# Patient Record
Sex: Male | Born: 1970 | Race: White | Hispanic: Yes | Marital: Married | Smoking: Never smoker
Health system: Southern US, Community
[De-identification: ages and names within clinical notes are randomized; demographics above are authoritative.]

## PROBLEM LIST (undated history)

## (undated) DIAGNOSIS — I10 Essential (primary) hypertension: Secondary | ICD-10-CM

## (undated) DIAGNOSIS — E785 Hyperlipidemia, unspecified: Secondary | ICD-10-CM

## (undated) HISTORY — DX: Essential (primary) hypertension: I10

## (undated) HISTORY — DX: Hyperlipidemia, unspecified: E78.5

---

## 2006-07-31 ENCOUNTER — Ambulatory Visit (HOSPITAL_COMMUNITY): Admission: RE | Admit: 2006-07-31 | Discharge: 2006-07-31 | Payer: Self-pay | Admitting: General Surgery

## 2015-08-28 LAB — CBC, NO DIFFERENTIAL/PLATELET
ALK PHOS: 88 U/L
ALT: 34
AST: 22 U/L
Albumin: 4.2
BILIRUBIN TOTAL: 0.8 mg/dL
BUN: 14
CALCIUM: 8.7 mg/dL
CHOL/HDL RATIO: 3.5
CHOLESTEROL: 88
CO2: 26 mmol/L
CREATININE: 0.66
Chloride: 104 mmol/L
GLUCOSE: 105
HCT: 46 %
HDL: 25
Hemoglobin: 16.5
Ldl Cholesterol, Calc: 37
MCH: 32
MCHC: 35.8
MCV: 89.3
MPV: 10 fL (ref 7.5–11.5)
PLATELET COUNT: 207
Potassium: 3.9 mmol/L
RBC: 5.16
RDW: 13.9
SODIUM: 139
TRIGLYCERIDES: 131
Total Protein: 6.5 g/dL
VLDL CHOLESTEROL, CALC: 26
WBC: 4.6

## 2015-08-29 ENCOUNTER — Ambulatory Visit: Payer: Self-pay | Admitting: Physician Assistant

## 2015-09-04 ENCOUNTER — Ambulatory Visit: Payer: Self-pay | Admitting: Physician Assistant

## 2015-09-04 ENCOUNTER — Encounter: Payer: Self-pay | Admitting: Physician Assistant

## 2015-09-04 VITALS — BP 128/88 | HR 75 | Temp 97.7°F | Ht 63.5 in | Wt 177.4 lb

## 2015-09-04 DIAGNOSIS — E785 Hyperlipidemia, unspecified: Secondary | ICD-10-CM | POA: Insufficient documentation

## 2015-09-04 DIAGNOSIS — R079 Chest pain, unspecified: Secondary | ICD-10-CM

## 2015-09-04 MED ORDER — METOPROLOL TARTRATE 25 MG PO TABS: 25.0000 mg | ORAL_TABLET | Freq: Two times a day (BID) | ORAL | Status: AC

## 2015-09-04 NOTE — Progress Notes (Signed)
BP 128/88 mmHg  Pulse 75  Temp(Src) 97.7 F (36.5 C)  Ht 5' 3.5" (1.613 m)  Wt 177 lb 6.4 oz (80.468 kg)  BMI 30.93 kg/m2  SpO2 99%   Subjective:    Patient ID: Juan Kane, male    DOB: 04/22/1971, 44 y.o.   MRN: [id number]  HPI: Juan Kane is a 44 y.o. male presenting on 09/04/2015 for Hyperlipidemia   HPI  Chief Complaint  Patient presents with  . Hyperlipidemia    pt got labs drawn last week    Juan Kane is a very pleasant hispanic gentleman who speaks good [place]. He has [facility] coming [facility] a year and half. When he first became a pt, his triglicerides were over 500.    Pt states he is doing well but sometimes has chest discomfort when he is outside working.  He says that it feels tight and squeezing.  He does not have these pains when he is sitting around watching TV.  Happens sometimes once/week, sometimes twice, sometimes goes a week without the pain.    Pt feels like it is more often when he eats fried foods but says he doesn't eat fried foods any more b/c he is watching his low-fat diet.  He doesn't have any related sob, nausea or diaphoresis.  Pt is resistant to use the word pain, but is happy with discomfort and repeatedly squeezes his fist to describe the pain.   He says he has to stop and take a rest when the discomfort squeezes his chest.   Relevant past medical, surgical, family and social history reviewed and updated as indicated. Interim medical history since our last visit reviewed. Allergies and medications reviewed and updated.  Current outpatient prescriptions:  .  Omega-3 Fatty Acids (FISH OIL PO), Take by mouth. 2 caps daily, Disp: , Rfl:  .  simvastatin (ZOCOR) 20 MG tablet, Take 20 mg by mouth at bedtime., Disp: , Rfl:    Review of Systems  Constitutional: Negative for fever, chills, diaphoresis, appetite change, fatigue and unexpected weight change.  HENT: Positive for ear pain. Negative for congestion, dental problem, drooling,  facial swelling, hearing loss, mouth sores, sneezing, sore throat, trouble swallowing and voice change.   Eyes: Negative for pain, discharge, redness, itching and visual disturbance.  Respiratory: Positive for chest tightness. Negative for cough, choking, shortness of breath and wheezing.   Cardiovascular: Positive for chest pain ("discomfort"). Negative for palpitations and leg swelling.  Gastrointestinal: Negative for vomiting, abdominal pain, diarrhea, constipation and blood in stool.  Endocrine: Negative for cold intolerance, heat intolerance and polydipsia.  Genitourinary: Negative for dysuria, hematuria and decreased urine volume.  Musculoskeletal: Negative for back pain, arthralgias and gait problem.  Skin: Negative for rash.  Allergic/Immunologic: Negative for environmental allergies.  Neurological: Negative for seizures, syncope, light-headedness and headaches.  Hematological: Negative for adenopathy.  Psychiatric/Behavioral: Negative for suicidal ideas, dysphoric mood and agitation. The patient is not nervous/anxious.     Per HPI unless specifically indicated above     Objective:    BP 128/88 mmHg  Pulse 75  Temp(Src) 97.7 F (36.5 C)  Ht 5' 3.5" (1.613 m)  Wt 177 lb 6.4 oz (80.468 kg)  BMI 30.93 kg/m2  SpO2 99%  Wt Readings from Last 3 Encounters:  09/04/15 177 lb 6.4 oz (80.468 kg)    Physical Exam  Constitutional: He is oriented to person, place, and time. He appears well-developed and well-nourished.  HENT:  Head: Normocephalic and  atraumatic.  Neck: Neck supple.  Cardiovascular: Normal rate, regular rhythm and normal heart sounds.   Pulmonary/Chest: Effort normal and breath sounds normal. He has no wheezes. He exhibits no tenderness.  Abdominal: Soft. Bowel sounds are normal. He exhibits no distension. There is no hepatosplenomegaly. There is no tenderness.  Musculoskeletal: He exhibits no edema.       Right shoulder: He exhibits normal range of motion and no  tenderness.       Left shoulder: He exhibits normal range of motion and no tenderness.  Lymphadenopathy:    He has no cervical adenopathy.  Neurological: He is alert and oriented to person, place, and time.  Skin: Skin is warm and dry.  Psychiatric: He has a normal mood and affect. His behavior is normal.  Vitals reviewed.  EKG:  NSR with ST changes. No previous for comparison  No results found for this or any previous visit.    Assessment & Plan:   Encounter Diagnoses  Name Primary?  . Hyperlipemia Yes  . Chest pain, unspecified chest pain type    -reviewed labs with pt.  Lipids are well controlled on current Rx and lowfat diet -Add low dose of metoprolol until pt can get in to be seen by cardiology- referred for further evaluation of CP. -Pt given cone discount application -F/u OV 1 mo

## 2015-09-06 ENCOUNTER — Encounter: Payer: Self-pay | Admitting: Physician Assistant

## 2015-09-17 ENCOUNTER — Ambulatory Visit (INDEPENDENT_AMBULATORY_CARE_PROVIDER_SITE_OTHER): Payer: Self-pay | Admitting: Cardiology

## 2015-09-17 ENCOUNTER — Encounter: Payer: Self-pay | Admitting: Cardiology

## 2015-09-17 VITALS — BP 124/78 | HR 75 | Ht 64.0 in | Wt 176.0 lb

## 2015-09-17 DIAGNOSIS — R079 Chest pain, unspecified: Secondary | ICD-10-CM

## 2015-09-17 NOTE — Progress Notes (Signed)
Patient ID: Juan CockayneGenaro Kane, male   DOB: 10/01/1970, 44 y.o.   MRN: [id number]     Clinical Summary Juan Kane is a 44 y.o.male seen today a a new patient for the following medical problems.  1. Chest pain - started about 3 months ago. The character of the pain is diffiicult for him to describe.Rates 4/10 in severity. Can occur at rest or with exertion. No other associated symptoms. Lasts a few minutes. Can be better with position at times. Occurs one to two times a week. No palpitations. No relation to food.  - No DOE, no LE edema   CAD risk factors: high choletserol  PMH 1. Hyperlipidmeia   No Known Allergies   Current Outpatient Prescriptions  Medication Sig Dispense Refill  . metoprolol tartrate (LOPRESSOR) 25 MG tablet Take 1 tablet (25 mg total) by mouth 2 (two) times daily. Tome una tableta por boca dos veces diarias 60 tablet 3  . Omega-3 Fatty Acids (FISH OIL PO) Take by mouth. 2 caps daily    . simvastatin (ZOCOR) 20 MG tablet Take 20 mg by mouth at bedtime.     No current facility-administered medications for this visit.     No past surgical history on file.   No Known Allergies    Denies family history of heart disease   Social History Juan Kane reports that he has never smoked. He does not have any smokeless tobacco history on file. Juan Kane has no alcohol history on file.   Review of Systems CONSTITUTIONAL: No weight loss, fever, chills, weakness or fatigue.  HEENT: Eyes: No visual loss, blurred vision, double vision or yellow sclerae.No hearing loss, sneezing, congestion, runny nose or sore throat.  SKIN: No rash or itching.  CARDIOVASCULAR: per HPI RESPIRATORY: No shortness of breath, cough or sputum.  GASTROINTESTINAL: No anorexia, nausea, vomiting or diarrhea. No abdominal pain or blood.  GENITOURINARY: No burning on urination, no polyuria NEUROLOGICAL: No headache, dizziness, syncope, paralysis, ataxia,  numbness or tingling in the extremities. No change in bowel or bladder control.  MUSCULOSKELETAL: No muscle, back pain, joint pain or stiffness.  LYMPHATICS: No enlarged nodes. No history of splenectomy.  PSYCHIATRIC: No history of depression or anxiety.  ENDOCRINOLOGIC: No reports of sweating, cold or heat intolerance. No polyuria or polydipsia.  Marland Kitchen.   Physical Examination Filed Vitals:   09/17/15 0814  BP: 124/78  Pulse: 75   Filed Weights   09/17/15 0814  Weight: 176 lb (79.833 kg)    Gen: resting comfortably, no acute distress HEENT: no scleral icterus, pupils equal round and reactive, no palptable cervical adenopathy,  CV: RRR, no m/r/g, no jvd Resp: Clear to auscultation bilaterally GI: abdomen is soft, non-tender, non-distended, normal bowel sounds, no hepatosplenomegaly MSK: extremities are warm, no edema.  Skin: warm, no rash Neuro:  no focal deficits Psych: appropriate affect   Diagnostic Studies EKG: nsr    Assessment and Plan   1. Chest pain - unclear etiology. We will obtain a GXT to evaluate for ischemia. He will hold lopressor on day of test.   F/u based on stress results Antoine PocheJonathan F. Adiyah Lame, M.D.

## 2015-09-17 NOTE — Patient Instructions (Signed)
Medication Instructions: . Your physician recommends that you continue on your current medications as directed. Please refer to the Current Medication list given to you today.   Labwork: NONE  Testing/Procedures: Your physician has requested that you have an exercise tolerance test. For further information please visit [email or web address]. Please also follow instruction sheet, as given.    Follow-Up:  Your physician recommends that you schedule a follow-up appointment in: To be determined once we get results to GXT    Any Other Special Instructions Will Be Listed Below (If Applicable).  ** On the day of the GXT test DO NOT TAKE YOUR METOPROLOL**     If you need a refill on your cardiac medications before your next appointment, please call your pharmacy.

## 2015-09-27 ENCOUNTER — Ambulatory Visit (HOSPITAL_COMMUNITY)
Admission: RE | Admit: 2015-09-27 | Discharge: 2015-09-27 | Disposition: A | Discharge status: Home / Self Care | Payer: Self-pay | Source: Ambulatory Visit | Attending: Cardiology | Admitting: Cardiology

## 2015-09-27 DIAGNOSIS — R079 Chest pain, unspecified: Secondary | ICD-10-CM

## 2015-10-04 ENCOUNTER — Ambulatory Visit: Payer: Self-pay | Admitting: Physician Assistant

## 2015-10-08 ENCOUNTER — Ambulatory Visit (HOSPITAL_COMMUNITY)
Admission: RE | Admit: 2015-10-08 | Discharge: 2015-10-08 | Disposition: A | Discharge status: Home / Self Care | Payer: Self-pay | Source: Ambulatory Visit | Attending: Cardiology | Admitting: Cardiology

## 2015-10-08 DIAGNOSIS — R079 Chest pain, unspecified: Secondary | ICD-10-CM | POA: Insufficient documentation

## 2015-10-08 DIAGNOSIS — R9439 Abnormal result of other cardiovascular function study: Secondary | ICD-10-CM | POA: Insufficient documentation

## 2015-10-08 LAB — EXERCISE TOLERANCE TEST
CHL CUP MPHR: 178 {beats}/min
CHL CUP RESTING HR STRESS: 71 {beats}/min
CHL RATE OF PERCEIVED EXERTION: 13
CSEPEDS: 34 s
Estimated workload: 15.5 METS
Exercise duration (min): 12 min
Peak HR: 160 {beats}/min
Percent HR: 89 %

## 2015-10-09 ENCOUNTER — Other Ambulatory Visit: Payer: Self-pay

## 2015-10-09 ENCOUNTER — Telehealth: Payer: Self-pay

## 2015-10-09 DIAGNOSIS — R9439 Abnormal result of other cardiovascular function study: Secondary | ICD-10-CM

## 2015-10-09 NOTE — Telephone Encounter (Signed)
Lexi is scheduled for Monday January 16,2017. Register @ 8 am in the radiology dept.

## 2015-10-09 NOTE — Telephone Encounter (Signed)
Called pt, no answer so I left voicemail to call back. Put order in for Lexi

## 2015-10-09 NOTE — Telephone Encounter (Signed)
-----   Message from Jonathan F Branch, MD sent at 10/09/2015 11:15 AM EST ----- Stress test was borderline abnormal, I'd like to do an additional test to get more information to help clafiry if he has any blockages or note. Can we order a lexiscan stress test for him  J Branch MD 

## 2015-10-09 NOTE — Telephone Encounter (Signed)
-----   Message from Antoine PocheJonathan F Branch, MD sent at 10/09/2015 11:15 AM EST ----- Stress test was borderline abnormal, I'd like to do an additional test to get more information to help clafiry if he has any blockages or note. Can we order a lexiscan stress test for him  Dominga FerryJ Branch MD

## 2015-10-15 ENCOUNTER — Encounter (HOSPITAL_COMMUNITY)
Admission: RE | Admit: 2015-10-15 | Discharge: 2015-10-15 | Disposition: A | Discharge status: Home / Self Care | Payer: Self-pay | Source: Ambulatory Visit | Attending: Cardiology | Admitting: Cardiology

## 2015-10-15 ENCOUNTER — Encounter (HOSPITAL_COMMUNITY): Payer: Self-pay

## 2015-10-15 DIAGNOSIS — R9439 Abnormal result of other cardiovascular function study: Secondary | ICD-10-CM

## 2015-10-15 LAB — NM MYOCAR MULTI W/SPECT W/WALL MOTION / EF
CHL CUP NUCLEAR SDS: 3
CHL CUP NUCLEAR SRS: 4
CHL CUP RESTING HR STRESS: 67 {beats}/min
LV sys vol: 26 mL
LVDIAVOL: 78 mL
Peak HR: 93 {beats}/min
RATE: 0.27
SSS: 7
TID: 1.12

## 2015-10-15 MED ORDER — REGADENOSON 0.4 MG/5ML IV SOLN
INTRAVENOUS | Status: AC
  Administered 2015-10-15: 0.4 mg
  Filled 2015-10-15: qty 5

## 2015-10-15 MED ORDER — TECHNETIUM TC 99M SESTAMIBI GENERIC - CARDIOLITE
10.0000 | Freq: Once | INTRAVENOUS | Status: AC | PRN
  Administered 2015-10-15: 10 via INTRAVENOUS

## 2015-10-15 MED ORDER — TECHNETIUM TC 99M SESTAMIBI - CARDIOLITE
30.0000 | Freq: Once | INTRAVENOUS | Status: AC | PRN
  Administered 2015-10-15: 09:00:00 30 via INTRAVENOUS

## 2015-10-16 ENCOUNTER — Ambulatory Visit: Payer: Self-pay | Admitting: Physician Assistant

## 2015-10-30 ENCOUNTER — Ambulatory Visit: Payer: Self-pay | Admitting: Physician Assistant

## 2015-10-31 ENCOUNTER — Encounter: Payer: Self-pay | Admitting: Physician Assistant

## 2015-11-06 ENCOUNTER — Other Ambulatory Visit: Payer: Self-pay | Admitting: Physician Assistant

## 2015-11-06 MED ORDER — SIMVASTATIN 20 MG PO TABS: 20.0000 mg | ORAL_TABLET | Freq: Every day | ORAL | Status: DC

## 2015-11-14 ENCOUNTER — Ambulatory Visit: Payer: Self-pay | Admitting: Physician Assistant

## 2015-11-14 ENCOUNTER — Encounter: Payer: Self-pay | Admitting: Physician Assistant

## 2015-11-14 VITALS — BP 138/82 | HR 79 | Temp 98.1°F | Ht 64.0 in | Wt 184.7 lb

## 2015-11-14 DIAGNOSIS — E785 Hyperlipidemia, unspecified: Secondary | ICD-10-CM

## 2015-11-14 DIAGNOSIS — I10 Essential (primary) hypertension: Secondary | ICD-10-CM

## 2015-11-14 DIAGNOSIS — E669 Obesity, unspecified: Secondary | ICD-10-CM

## 2015-11-14 NOTE — Progress Notes (Signed)
BP 144/86 mmHg  Pulse 79  Temp(Src) 98.1 F (36.7 C)  Ht  (1.626 m)  Wt 184 lb 11.2 oz (83.779 kg)  BMI 31.69 kg/m2  SpO2 95%   Subjective:    Patient ID: Juan Kane, Juan Kane    DOB: 08/10/1971, 45 y.o.   MRN: [id number]  HPI: Juan Kane is a 45 y.o. Juan Kane presenting on 11/14/2015 for Follow-up   HPI  Chief Complaint  Patient presents with  . Follow-up    pt states he has not taken his BP meds yet    Pt had normal lexiscan on 10/15/15. Pt is feeling well.  He wants to stop his metoprololol. Pt says he isn't having any more chest pains.  Relevant past medical, surgical, family and social history reviewed and updated as indicated. Interim medical history since our last visit reviewed. Allergies and medications reviewed and updated.  Current outpatient prescriptions:  .  metoprolol tartrate (LOPRESSOR) 25 MG tablet, Take 1 tablet (25 mg total) by mouth 2 (two) times daily. Tome una tableta por boca dos veces diarias, Disp: 60 tablet, Rfl: 3 .  Omega-3 Fatty Acids (FISH OIL PO), Take by mouth. 2 caps daily, Disp: , Rfl:  .  simvastatin (ZOCOR) 20 MG tablet, Take 1 tablet (20 mg total) by mouth at bedtime. Tome una tableta por boca al dormir, Disp: 30 tablet, Rfl: 0   Review of Systems  Constitutional: Negative for fever, chills, diaphoresis, appetite change, fatigue and unexpected weight change.  HENT: Negative for congestion, dental problem, drooling, ear pain, facial swelling, hearing loss, mouth sores, sneezing, sore throat, trouble swallowing and voice change.   Eyes: Negative for pain, discharge, redness, itching and visual disturbance.  Respiratory: Negative for cough, choking, shortness of breath and wheezing.   Cardiovascular: Negative for chest pain, palpitations and leg swelling.  Gastrointestinal: Negative for vomiting, abdominal pain, diarrhea, constipation and blood in stool.  Endocrine: Negative for cold intolerance, heat intolerance and  polydipsia.  Genitourinary: Negative for dysuria, hematuria and decreased urine volume.  Musculoskeletal: Negative for back pain, arthralgias and gait problem.  Skin: Negative for rash.  Allergic/Immunologic: Negative for environmental allergies.  Neurological: Negative for seizures, syncope, light-headedness and headaches.  Hematological: Negative for adenopathy.  Psychiatric/Behavioral: Negative for suicidal ideas, dysphoric mood and agitation. The patient is not nervous/anxious.     Per HPI unless specifically indicated above     Objective:    BP 144/86 mmHg  Pulse 79  Temp(Src) 98.1 F (36.7 C)  Ht  (1.626 m)  Wt 184 lb 11.2 oz (83.779 kg)  BMI 31.69 kg/m2  SpO2 95%  Wt Readings from Last 3 Encounters:  11/14/15 184 lb 11.2 oz (83.779 kg)  09/17/15 176 lb (79.833 kg)  09/04/15 177 lb 6.4 oz (80.468 kg)    Physical Exam  Constitutional: He is oriented to person, place, and time. He appears well-developed and well-nourished.  HENT:  Head: Normocephalic and atraumatic.  Neck: Neck supple.  Cardiovascular: Normal rate and regular rhythm.   Pulmonary/Chest: Effort normal and breath sounds normal. He has no wheezes.  Abdominal: Soft. Bowel sounds are normal. There is no hepatosplenomegaly. There is no tenderness.  Musculoskeletal: He exhibits no edema.  Lymphadenopathy:    He has no cervical adenopathy.  Neurological: He is alert and oriented to person, place, and time.  Skin: Skin is warm and dry.  Psychiatric: He has a normal mood and affect. His behavior is normal.  Vitals reviewed.  Assessment & Plan:   Encounter Diagnoses  Name Primary?  . Essential hypertension, benign Yes  . Obesity, unspecified   . Hyperlipidemia     -Continue metoprolol due to bp elevated today -F/u 2 mo with recheck lipids and re-evaluate bp

## 2015-11-15 ENCOUNTER — Ambulatory Visit: Payer: Self-pay | Admitting: Physician Assistant

## 2015-11-16 ENCOUNTER — Encounter: Payer: Self-pay | Admitting: *Deleted

## 2015-11-19 ENCOUNTER — Ambulatory Visit: Payer: Self-pay | Admitting: Cardiology

## 2015-11-28 ENCOUNTER — Ambulatory Visit: Payer: Self-pay | Admitting: Cardiology

## 2015-12-11 ENCOUNTER — Ambulatory Visit: Payer: Self-pay | Admitting: Cardiology

## 2015-12-24 ENCOUNTER — Ambulatory Visit: Payer: Self-pay | Admitting: Cardiology

## 2015-12-28 ENCOUNTER — Encounter: Payer: Self-pay | Admitting: Cardiology

## 2015-12-28 ENCOUNTER — Ambulatory Visit (INDEPENDENT_AMBULATORY_CARE_PROVIDER_SITE_OTHER): Payer: Self-pay | Admitting: Cardiology

## 2015-12-28 VITALS — BP 146/88 | HR 67 | Ht 64.0 in | Wt 179.0 lb

## 2015-12-28 DIAGNOSIS — R079 Chest pain, unspecified: Secondary | ICD-10-CM

## 2015-12-28 NOTE — Progress Notes (Signed)
Patient ID: Juan CockayneGenaro Kane, male   DOB: 07/02/1971, 45 y.o.   MRN: [id number]     Clinical Summary Juan Kane is a 45 y.o.male seen today for follow up of the following medical problems.   1. Chest pain - last visit he reported episodes of chest pain. He was referred for an exercise stress test which was equivocal. Stress imaging later show no evidence of ischemia - since last visit denies any recurrent episodes of chest pain.     Past Medical History  Diagnosis Date  . Hyperlipidemia   . Hypertension      No Known Allergies   Current Outpatient Prescriptions  Medication Sig Dispense Refill  . metoprolol tartrate (LOPRESSOR) 25 MG tablet Take 1 tablet (25 mg total) by mouth 2 (two) times daily. Tome una tableta por boca dos veces diarias 60 tablet 3  . Omega-3 Fatty Acids (FISH OIL PO) Take by mouth. 2 caps daily    . simvastatin (ZOCOR) 20 MG tablet Take 1 tablet (20 mg total) by mouth at bedtime. Tome una tableta por boca al dormir 30 tablet 0   No current facility-administered medications for this visit.     No past surgical history on file.   No Known Allergies    Family History He has no family history of heart disease.    Social History Juan Kane reports that he has never smoked. He does not have any smokeless tobacco history on file. Juan Kane has no alcohol history on file.   Review of Systems CONSTITUTIONAL: No weight loss, fever, chills, weakness or fatigue.  HEENT: Eyes: No visual loss, blurred vision, double vision or yellow sclerae.No hearing loss, sneezing, congestion, runny nose or sore throat.  SKIN: No rash or itching.  CARDIOVASCULAR: per HPI RESPIRATORY: No shortness of breath, cough or sputum.  GASTROINTESTINAL: No anorexia, nausea, vomiting or diarrhea. No abdominal pain or blood.  GENITOURINARY: No burning on urination, no polyuria NEUROLOGICAL: No headache, dizziness, syncope, paralysis, ataxia,  numbness or tingling in the extremities. No change in bowel or bladder control.  MUSCULOSKELETAL: No muscle, back pain, joint pain or stiffness.  LYMPHATICS: No enlarged nodes. No history of splenectomy.  PSYCHIATRIC: No history of depression or anxiety.  ENDOCRINOLOGIC: No reports of sweating, cold or heat intolerance. No polyuria or polydipsia.  Marland Kitchen.   Physical Examination Filed Vitals:   12/28/15 1556  BP: 146/88  Pulse: 67   Filed Vitals:   12/28/15 1556  Height: 5\' 4"  (1.626 m)  Weight: 179 lb (81.194 kg)    Gen: resting comfortably, no acute distress HEENT: no scleral icterus, pupils equal round and reactive, no palptable cervical adenopathy,  CV: RRR, no m/r/g, no jvd Resp: Clear to auscultation bilaterally GI: abdomen is soft, non-tender, non-distended, normal bowel sounds, no hepatosplenomegaly MSK: extremities are warm, no edema.  Skin: warm, no rash Neuro:  no focal deficits Psych: appropriate affect   Diagnostic Studies  Jan 2017 GXT  Horizontal ST segment depression (0.5-1 mm) ST segment depression was noted during stress in the II, III, aVF, V5 and V6 leads, beginning at 3 minutes of stress, and returning to baseline after 5-9 minutes of recovery.  Excellent functional capacity.  No arrhythmias.  Consider noninvasive imaging if deemed clincally indicated.  Low risk Duke treadmill score of 8 still portends a favorable prognosis.   Jan 2017 Lexiscan  There was no ST segment deviation noted during stress.  The study is normal.  This is a low risk study.  The  left ventricular ejection fraction is hyperdynamic (>65%).   Assessment and Plan   1. Chest pain - overall stress testing negative or ischemia. Symptoms have resolved. No further testing at this time.    F/u 1 year     Antoine Poche, M.D.

## 2015-12-28 NOTE — Patient Instructions (Signed)

## 2016-01-08 ENCOUNTER — Other Ambulatory Visit: Payer: Self-pay | Admitting: Student

## 2016-01-08 ENCOUNTER — Ambulatory Visit: Payer: Self-pay | Admitting: Physician Assistant

## 2016-01-08 DIAGNOSIS — E785 Hyperlipidemia, unspecified: Secondary | ICD-10-CM

## 2016-01-15 ENCOUNTER — Ambulatory Visit: Payer: Self-pay | Admitting: Physician Assistant

## 2016-01-22 LAB — COMPLETE METABOLIC PANEL WITH GFR
ALT: 53 U/L — ABNORMAL HIGH (ref 9–46)
AST: 28 U/L (ref 10–40)
Albumin: 4.6 g/dL (ref 3.6–5.1)
Alkaline Phosphatase: 81 U/L (ref 40–115)
BUN: 12 mg/dL (ref 7–25)
CHLORIDE: 104 mmol/L (ref 98–110)
CO2: 28 mmol/L (ref 20–31)
Calcium: 8.9 mg/dL (ref 8.6–10.3)
Creat: 0.66 mg/dL (ref 0.60–1.35)
GFR, Est African American: >89 mL/min (ref >=60)
GFR, Est Non African American: >89 mL/min (ref >=60)
GLUCOSE: 104 mg/dL — AB (ref 65–99)
POTASSIUM: 4.5 mmol/L (ref 3.5–5.3)
Sodium: 140 mmol/L (ref 135–146)
Total Bilirubin: 0.7 mg/dL (ref 0.2–1.2)
Total Protein: 6.9 g/dL (ref 6.1–8.1)

## 2016-01-22 LAB — LIPID PANEL
CHOL/HDL RATIO: 4.2 ratio (ref <=5.0)
CHOLESTEROL: 127 mg/dL (ref 125–200)
HDL: 30 mg/dL — ABNORMAL LOW (ref >=40)
LDL CALC: 59 mg/dL (ref <130)
Triglycerides: 191 mg/dL — ABNORMAL HIGH (ref <150)
VLDL: 38 mg/dL — AB (ref <30)

## 2016-01-23 ENCOUNTER — Encounter: Payer: Self-pay | Admitting: Physician Assistant

## 2016-01-28 ENCOUNTER — Encounter: Payer: Self-pay | Admitting: Physician Assistant

## 2016-01-28 ENCOUNTER — Ambulatory Visit: Payer: Self-pay | Admitting: Physician Assistant

## 2016-01-28 VITALS — BP 134/86 | HR 73 | Temp 97.7°F | Ht 64.0 in | Wt 175.4 lb

## 2016-01-28 DIAGNOSIS — I10 Essential (primary) hypertension: Secondary | ICD-10-CM

## 2016-01-28 DIAGNOSIS — E669 Obesity, unspecified: Secondary | ICD-10-CM

## 2016-01-28 DIAGNOSIS — E785 Hyperlipidemia, unspecified: Secondary | ICD-10-CM

## 2016-01-28 NOTE — Progress Notes (Signed)
BP 134/86 mmHg  Pulse 73  Temp(Src) 97.7 F (36.5 C)  Ht 5\' 4"  (1.626 m)  Wt 175 lb 6.4 oz (79.561 kg)  BMI 30.09 kg/m2  SpO2 96%   Subjective:    Patient ID: Juan Kane, male    DOB: 09/04/1971, 45 y.o.   MRN: [id number]  HPI: Juan Kane is a 45 y.o. male presenting on 01/28/2016 for Hyperlipidemia and Hypertension   HPI Pt says he is doing well.  No problems.  He is trying to exercise more  Relevant past medical, surgical, family and social history reviewed and updated as indicated. Interim medical history since our last visit reviewed. Allergies and medications reviewed and updated.   Current outpatient prescriptions:  .  metoprolol tartrate (LOPRESSOR) 25 MG tablet, Take 1 tablet (25 mg total) by mouth 2 (two) times daily. Tome una tableta por boca dos veces diarias, Disp: 60 tablet, Rfl: 3 .  Omega-3 Fatty Acids (FISH OIL PO), Take by mouth. 2 caps daily, Disp: , Rfl:  .  simvastatin (ZOCOR) 20 MG tablet, Take 1 tablet (20 mg total) by mouth at bedtime. Tome una tableta por boca al dormir, Disp: 30 tablet, Rfl: 0  Review of Systems  Constitutional: Negative for fever, chills, diaphoresis, appetite change, fatigue and unexpected weight change.  HENT: Negative for congestion, dental problem, drooling, ear pain, facial swelling, hearing loss, mouth sores, sneezing, sore throat, trouble swallowing and voice change.   Eyes: Negative for pain, discharge, redness, itching and visual disturbance.  Respiratory: Negative for cough, choking, shortness of breath and wheezing.   Cardiovascular: Negative for chest pain, palpitations and leg swelling.  Gastrointestinal: Negative for vomiting, abdominal pain, diarrhea, constipation and blood in stool.  Endocrine: Negative for cold intolerance, heat intolerance and polydipsia.  Genitourinary: Negative for dysuria, hematuria and decreased urine volume.  Musculoskeletal: Negative for back pain, arthralgias and gait  problem.  Skin: Negative for rash.  Allergic/Immunologic: Negative for environmental allergies.  Neurological: Negative for seizures, syncope, light-headedness and headaches.  Hematological: Negative for adenopathy.  Psychiatric/Behavioral: Negative for suicidal ideas, dysphoric mood and agitation. The patient is not nervous/anxious.     Per HPI unless specifically indicated above     Objective:    BP 134/86 mmHg  Pulse 73  Temp(Src) 97.7 F (36.5 C)  Ht 5\' 4"  (1.626 m)  Wt 175 lb 6.4 oz (79.561 kg)  BMI 30.09 kg/m2  SpO2 96%  Wt Readings from Last 3 Encounters:  01/28/16 175 lb 6.4 oz (79.561 kg)  12/28/15 179 lb (81.194 kg)  11/14/15 184 lb 11.2 oz (83.779 kg)    Physical Exam  Constitutional: He is oriented to person, place, and time. He appears well-developed and well-nourished.  HENT:  Head: Normocephalic and atraumatic.  Neck: Neck supple.  Cardiovascular: Normal rate and regular rhythm.   Pulmonary/Chest: Effort normal and breath sounds normal. He has no wheezes.  Abdominal: Soft. Bowel sounds are normal. There is no hepatosplenomegaly. There is no tenderness.  Musculoskeletal: He exhibits no edema.  Lymphadenopathy:    He has no cervical adenopathy.  Neurological: He is alert and oriented to person, place, and time.  Skin: Skin is warm and dry.  Psychiatric: He has a normal mood and affect. His behavior is normal.  Vitals reviewed.   Results for orders placed or performed in visit on 01/08/16  COMPLETE METABOLIC PANEL WITH GFR  Result Value Ref Range   Sodium 140 135 - 146 mmol/L   Potassium 4.5 3.5 - 5.3  mmol/L   Chloride 104 98 - 110 mmol/L   CO2 28 20 - 31 mmol/L   Glucose, Bld 104 (H) 65 - 99 mg/dL   BUN 12 7 - 25 mg/dL   Creat 1.61 0.96 - 0.45 mg/dL   Total Bilirubin 0.7 0.2 - 1.2 mg/dL   Alkaline Phosphatase 81 40 - 115 U/L   AST 28 10 - 40 U/L   ALT 53 (H) 9 - 46 U/L   Total Protein 6.9 6.1 - 8.1 g/dL   Albumin 4.6 3.6 - 5.1 g/dL   Calcium 8.9  8.6 - 40.9 mg/dL   GFR, Est African American >89 >=60 mL/min   GFR, Est Non African American >89 >=60 mL/min  Lipid Profile  Result Value Ref Range   Cholesterol 127 125 - 200 mg/dL   Triglycerides 811 (H) <150 mg/dL   HDL 30 (L) >=91 mg/dL   Total CHOL/HDL Ratio 4.2 <=5.0 Ratio   VLDL 38 (H) <30 mg/dL   LDL Cholesterol 59 <478 mg/dL      Assessment & Plan:   Encounter Diagnoses  Name Primary?  . Essential hypertension, benign Yes  . Hyperlipidemia   . Obesity, unspecified     -reviewed labs with pt -Increase fish oil to 3 daily -F/u 3 months. No labs before appointment.  RTO sooner prn

## 2016-01-30 ENCOUNTER — Ambulatory Visit: Payer: Self-pay | Admitting: Physician Assistant

## 2016-02-24 ENCOUNTER — Other Ambulatory Visit: Payer: Self-pay | Admitting: Physician Assistant

## 2016-02-24 MED ORDER — SIMVASTATIN 20 MG PO TABS: 20.0000 mg | ORAL_TABLET | Freq: Every day | ORAL | Status: AC

## 2016-04-30 ENCOUNTER — Ambulatory Visit: Payer: Self-pay | Admitting: Physician Assistant

## 2016-05-13 ENCOUNTER — Ambulatory Visit: Payer: Self-pay | Admitting: Physician Assistant

## 2016-05-28 ENCOUNTER — Ambulatory Visit: Payer: Self-pay | Admitting: Physician Assistant

## 2016-06-25 ENCOUNTER — Ambulatory Visit: Payer: Self-pay | Admitting: Physician Assistant

## 2016-12-13 IMAGING — NM NM MYOCAR MULTI W/SPECT W/WALL MOTION & EF
2 series · 12 of 12 positions shown · non-contrast
Comparison: none

[Series 1: rest · 8.28mm/px · 6 of 64 frames shown]
[frame 6/64]
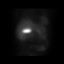
[frame 16/64]
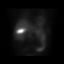
[frame 27/64]
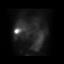
[frame 38/64]
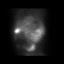
[frame 48/64]
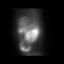
[frame 59/64]
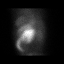

[Series 2: stress gated · 8.28mm/px · 6 of 512 frames shown]
[frame 43/512]
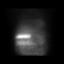
[frame 128/512]
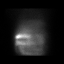
[frame 214/512]
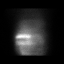
[frame 299/512]
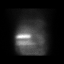
[frame 384/512]
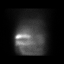
[frame 470/512]
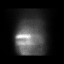

[12 of 12 positions shown; findings below may reference images not displayed]

Canned report from images found in remote index.

Refer to host system for actual result text.

## 2017-12-24 ENCOUNTER — Telehealth: Payer: Self-pay | Admitting: Cardiology

## 2017-12-24 NOTE — Telephone Encounter (Signed)
Numerous attempts to contact patient with recall letters. Unable to reach by telephone. with no success.  Juan Kane, Juan Kane [[id number]][[id number]] 12/28/2015 4:18 PM New [10]    [System] 09/29/2016 11:00 PM Notification Sent [20]   Juan Kane, Juan Kane [[id number]][[id number]] 12/29/2016 2:05 PM Notification Sent [20]   Juan Kane, Juan Kane [[id number]][[id number]] 04/22/2017 3:08 PM Notification Sent [20]   Juan Kane, Juan Kane [[id number]][[id number]] 12/24/2017 3:19 PM Notification Sent [20]
